# Patient Record
Sex: Female | Born: 2000 | Race: White | Hispanic: No | Marital: Single
Health system: Southern US, Community
[De-identification: ages and names within clinical notes are randomized; demographics above are authoritative.]

---
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
## 2021-02-26 ENCOUNTER — Other Ambulatory Visit (HOSPITAL_COMMUNITY): Payer: Self-pay

## 2021-02-26 MED ORDER — NORETHINDRONE ACET-ETHINYL EST 1-20 MG-MCG PO TABS
ORAL_TABLET | ORAL | 0 refills | Status: AC
  Filled 2021-02-26: qty 21, 21d supply, fill #0

## 2021-02-28 ENCOUNTER — Other Ambulatory Visit (HOSPITAL_COMMUNITY): Payer: Self-pay

## 2021-03-04 ENCOUNTER — Other Ambulatory Visit (HOSPITAL_COMMUNITY): Payer: Self-pay

## 2021-03-04 DIAGNOSIS — N926 Irregular menstruation, unspecified: Secondary | ICD-10-CM | POA: Diagnosis not present

## 2021-03-04 MED ORDER — NORETHINDRONE ACET-ETHINYL EST 1-20 MG-MCG PO TABS
1.0000 | ORAL_TABLET | Freq: Every day | ORAL | 3 refills | Status: AC
  Filled 2021-04-01: qty 84, 84d supply, fill #0

## 2021-04-01 ENCOUNTER — Other Ambulatory Visit (HOSPITAL_COMMUNITY): Payer: Self-pay

## 2021-08-23 DIAGNOSIS — H5213 Myopia, bilateral: Secondary | ICD-10-CM | POA: Diagnosis not present

## 2021-12-23 DIAGNOSIS — F418 Other specified anxiety disorders: Secondary | ICD-10-CM | POA: Diagnosis not present

## 2021-12-23 DIAGNOSIS — N912 Amenorrhea, unspecified: Secondary | ICD-10-CM | POA: Diagnosis not present

## 2022-01-20 ENCOUNTER — Other Ambulatory Visit (HOSPITAL_COMMUNITY): Payer: Self-pay

## 2022-01-20 DIAGNOSIS — F4323 Adjustment disorder with mixed anxiety and depressed mood: Secondary | ICD-10-CM | POA: Diagnosis not present

## 2022-01-21 ENCOUNTER — Other Ambulatory Visit (HOSPITAL_COMMUNITY): Payer: Self-pay

## 2022-01-21 ENCOUNTER — Other Ambulatory Visit: Payer: Self-pay

## 2022-01-21 MED ORDER — ESCITALOPRAM OXALATE 10 MG PO TABS
ORAL_TABLET | ORAL | 0 refills | Status: AC
  Filled 2022-01-21: qty 30, 30d supply, fill #0

## 2022-01-22 ENCOUNTER — Other Ambulatory Visit (HOSPITAL_COMMUNITY): Payer: Self-pay

## 2022-01-24 ENCOUNTER — Other Ambulatory Visit (HOSPITAL_COMMUNITY): Payer: Self-pay

## 2022-02-03 DIAGNOSIS — N911 Secondary amenorrhea: Secondary | ICD-10-CM | POA: Diagnosis not present

## 2022-02-03 DIAGNOSIS — Z3202 Encounter for pregnancy test, result negative: Secondary | ICD-10-CM | POA: Diagnosis not present

## 2022-02-04 DIAGNOSIS — F4323 Adjustment disorder with mixed anxiety and depressed mood: Secondary | ICD-10-CM | POA: Diagnosis not present

## 2022-02-07 ENCOUNTER — Other Ambulatory Visit (HOSPITAL_COMMUNITY): Payer: Self-pay

## 2022-02-07 DIAGNOSIS — F418 Other specified anxiety disorders: Secondary | ICD-10-CM | POA: Diagnosis not present

## 2022-02-07 DIAGNOSIS — Z6841 Body Mass Index (BMI) 40.0 and over, adult: Secondary | ICD-10-CM | POA: Diagnosis not present

## 2022-02-07 MED ORDER — PROPRANOLOL HCL 20 MG PO TABS
ORAL_TABLET | ORAL | 0 refills | Status: DC
  Filled 2022-02-07: qty 90, 30d supply, fill #0

## 2022-02-18 DIAGNOSIS — F4323 Adjustment disorder with mixed anxiety and depressed mood: Secondary | ICD-10-CM | POA: Diagnosis not present

## 2022-02-18 DIAGNOSIS — N911 Secondary amenorrhea: Secondary | ICD-10-CM | POA: Diagnosis not present

## 2022-02-18 DIAGNOSIS — Z01419 Encounter for gynecological examination (general) (routine) without abnormal findings: Secondary | ICD-10-CM | POA: Diagnosis not present

## 2022-03-04 DIAGNOSIS — F4323 Adjustment disorder with mixed anxiety and depressed mood: Secondary | ICD-10-CM | POA: Diagnosis not present

## 2022-03-05 ENCOUNTER — Other Ambulatory Visit (HOSPITAL_COMMUNITY): Payer: Self-pay

## 2022-03-06 ENCOUNTER — Other Ambulatory Visit (HOSPITAL_COMMUNITY): Payer: Self-pay

## 2022-03-07 ENCOUNTER — Other Ambulatory Visit (HOSPITAL_COMMUNITY): Payer: Self-pay

## 2022-03-07 MED ORDER — PROPRANOLOL HCL ER 80 MG PO CP24
ORAL_CAPSULE | ORAL | 1 refills | Status: DC
  Filled 2022-03-07: qty 30, 30d supply, fill #0

## 2022-03-07 MED ORDER — FLUOXETINE HCL 20 MG PO CAPS
ORAL_CAPSULE | ORAL | 1 refills | Status: AC
  Filled 2022-03-07: qty 30, 30d supply, fill #0

## 2022-03-10 ENCOUNTER — Other Ambulatory Visit (HOSPITAL_COMMUNITY): Payer: Self-pay

## 2022-03-13 ENCOUNTER — Other Ambulatory Visit (HOSPITAL_COMMUNITY): Payer: Self-pay

## 2022-03-13 DIAGNOSIS — Z6841 Body Mass Index (BMI) 40.0 and over, adult: Secondary | ICD-10-CM | POA: Diagnosis not present

## 2022-03-13 DIAGNOSIS — F418 Other specified anxiety disorders: Secondary | ICD-10-CM | POA: Diagnosis not present

## 2022-03-13 MED ORDER — PROPRANOLOL HCL 20 MG PO TABS
ORAL_TABLET | ORAL | 1 refills | Status: AC
  Filled 2022-03-13: qty 90, 30d supply, fill #0

## 2022-03-13 MED ORDER — FLUVOXAMINE MALEATE 50 MG PO TABS
ORAL_TABLET | ORAL | 0 refills | Status: DC
  Filled 2022-04-04: qty 30, 30d supply, fill #0

## 2022-03-13 MED ORDER — PROPRANOLOL HCL ER 80 MG PO CP24
ORAL_CAPSULE | ORAL | 3 refills | Status: AC
  Filled 2022-03-13 – 2022-04-04 (×2): qty 90, 90d supply, fill #0
  Filled 2022-07-08: qty 90, 90d supply, fill #1
  Filled 2022-10-07: qty 90, 90d supply, fill #2

## 2022-03-18 DIAGNOSIS — F4323 Adjustment disorder with mixed anxiety and depressed mood: Secondary | ICD-10-CM | POA: Diagnosis not present

## 2022-03-25 DIAGNOSIS — N911 Secondary amenorrhea: Secondary | ICD-10-CM | POA: Diagnosis not present

## 2022-03-25 DIAGNOSIS — Z309 Encounter for contraceptive management, unspecified: Secondary | ICD-10-CM | POA: Diagnosis not present

## 2022-03-25 DIAGNOSIS — L680 Hirsutism: Secondary | ICD-10-CM | POA: Diagnosis not present

## 2022-03-25 DIAGNOSIS — E282 Polycystic ovarian syndrome: Secondary | ICD-10-CM | POA: Diagnosis not present

## 2022-03-26 ENCOUNTER — Other Ambulatory Visit (HOSPITAL_COMMUNITY): Payer: Self-pay

## 2022-03-26 MED ORDER — LO LOESTRIN FE 1 MG-10 MCG / 10 MCG PO TABS
ORAL_TABLET | ORAL | 0 refills | Status: DC
  Filled 2022-03-26: qty 84, 84d supply, fill #0

## 2022-03-27 ENCOUNTER — Other Ambulatory Visit (HOSPITAL_COMMUNITY): Payer: Self-pay

## 2022-03-27 MED ORDER — NORETHIN ACE-ETH ESTRAD-FE 1-20 MG-MCG PO TABS
ORAL_TABLET | ORAL | 0 refills | Status: AC
  Filled 2022-03-27: qty 84, 84d supply, fill #0

## 2022-03-28 ENCOUNTER — Other Ambulatory Visit (HOSPITAL_COMMUNITY): Payer: Self-pay

## 2022-04-01 DIAGNOSIS — F4323 Adjustment disorder with mixed anxiety and depressed mood: Secondary | ICD-10-CM | POA: Diagnosis not present

## 2022-04-02 DIAGNOSIS — Z0000 Encounter for general adult medical examination without abnormal findings: Secondary | ICD-10-CM | POA: Diagnosis not present

## 2022-04-04 ENCOUNTER — Other Ambulatory Visit (HOSPITAL_COMMUNITY): Payer: Self-pay

## 2022-04-07 ENCOUNTER — Other Ambulatory Visit (HOSPITAL_COMMUNITY): Payer: Self-pay

## 2022-04-08 ENCOUNTER — Other Ambulatory Visit (HOSPITAL_COMMUNITY): Payer: Self-pay

## 2022-04-23 DIAGNOSIS — F4323 Adjustment disorder with mixed anxiety and depressed mood: Secondary | ICD-10-CM | POA: Diagnosis not present

## 2022-04-25 ENCOUNTER — Other Ambulatory Visit (HOSPITAL_COMMUNITY): Payer: Self-pay

## 2022-04-25 DIAGNOSIS — E282 Polycystic ovarian syndrome: Secondary | ICD-10-CM | POA: Diagnosis not present

## 2022-04-25 DIAGNOSIS — Z6841 Body Mass Index (BMI) 40.0 and over, adult: Secondary | ICD-10-CM | POA: Diagnosis not present

## 2022-04-25 DIAGNOSIS — F418 Other specified anxiety disorders: Secondary | ICD-10-CM | POA: Diagnosis not present

## 2022-04-25 DIAGNOSIS — Z0001 Encounter for general adult medical examination with abnormal findings: Secondary | ICD-10-CM | POA: Diagnosis not present

## 2022-04-25 MED ORDER — PROPRANOLOL HCL 20 MG PO TABS
20.0000 mg | ORAL_TABLET | Freq: Three times a day (TID) | ORAL | 1 refills | Status: AC | PRN
  Filled 2022-04-25: qty 90, 30d supply, fill #0

## 2022-04-25 MED ORDER — FLUVOXAMINE MALEATE 50 MG PO TABS
50.0000 mg | ORAL_TABLET | Freq: Every day | ORAL | 0 refills | Status: AC
  Filled 2022-04-25: qty 90, 90d supply, fill #0

## 2022-04-25 MED ORDER — PROPRANOLOL HCL ER 80 MG PO CP24
80.0000 mg | ORAL_CAPSULE | Freq: Every day | ORAL | 1 refills | Status: DC
  Filled 2022-04-25 – 2023-01-07 (×2): qty 90, 90d supply, fill #0
  Filled 2023-04-10: qty 90, 90d supply, fill #1

## 2022-04-29 DIAGNOSIS — E282 Polycystic ovarian syndrome: Secondary | ICD-10-CM | POA: Diagnosis not present

## 2022-05-01 ENCOUNTER — Other Ambulatory Visit (HOSPITAL_COMMUNITY): Payer: Self-pay

## 2022-05-08 DIAGNOSIS — F4323 Adjustment disorder with mixed anxiety and depressed mood: Secondary | ICD-10-CM | POA: Diagnosis not present

## 2022-05-22 DIAGNOSIS — F4323 Adjustment disorder with mixed anxiety and depressed mood: Secondary | ICD-10-CM | POA: Diagnosis not present

## 2022-06-02 ENCOUNTER — Other Ambulatory Visit (HOSPITAL_COMMUNITY): Payer: Self-pay

## 2022-06-02 MED ORDER — ESCITALOPRAM OXALATE 10 MG PO TABS
ORAL_TABLET | ORAL | 1 refills | Status: DC
  Filled 2022-06-02: qty 30, 30d supply, fill #0
  Filled 2022-07-08: qty 30, 30d supply, fill #1

## 2022-06-05 DIAGNOSIS — F4323 Adjustment disorder with mixed anxiety and depressed mood: Secondary | ICD-10-CM | POA: Diagnosis not present

## 2022-06-16 ENCOUNTER — Other Ambulatory Visit (HOSPITAL_COMMUNITY): Payer: Self-pay

## 2022-06-16 DIAGNOSIS — N926 Irregular menstruation, unspecified: Secondary | ICD-10-CM | POA: Diagnosis not present

## 2022-06-16 DIAGNOSIS — E282 Polycystic ovarian syndrome: Secondary | ICD-10-CM | POA: Diagnosis not present

## 2022-06-16 MED ORDER — SLYND 4 MG PO TABS
ORAL_TABLET | ORAL | 3 refills | Status: AC
  Filled 2022-06-16: qty 84, 84d supply, fill #0
  Filled 2022-10-07: qty 84, 84d supply, fill #1

## 2022-06-19 DIAGNOSIS — F4323 Adjustment disorder with mixed anxiety and depressed mood: Secondary | ICD-10-CM | POA: Diagnosis not present

## 2022-07-03 DIAGNOSIS — F4323 Adjustment disorder with mixed anxiety and depressed mood: Secondary | ICD-10-CM | POA: Diagnosis not present

## 2022-07-08 ENCOUNTER — Other Ambulatory Visit (HOSPITAL_COMMUNITY): Payer: Self-pay

## 2022-07-17 DIAGNOSIS — F4323 Adjustment disorder with mixed anxiety and depressed mood: Secondary | ICD-10-CM | POA: Diagnosis not present

## 2022-08-07 ENCOUNTER — Other Ambulatory Visit (HOSPITAL_COMMUNITY): Payer: Self-pay

## 2022-08-12 ENCOUNTER — Other Ambulatory Visit (HOSPITAL_COMMUNITY): Payer: Self-pay

## 2022-08-13 ENCOUNTER — Other Ambulatory Visit (HOSPITAL_COMMUNITY): Payer: Self-pay

## 2022-08-13 DIAGNOSIS — F4323 Adjustment disorder with mixed anxiety and depressed mood: Secondary | ICD-10-CM | POA: Diagnosis not present

## 2022-08-13 MED ORDER — ESCITALOPRAM OXALATE 10 MG PO TABS
10.0000 mg | ORAL_TABLET | Freq: Every day | ORAL | 1 refills | Status: AC
  Filled 2022-08-13: qty 90, 90d supply, fill #0
  Filled 2022-11-19: qty 90, 90d supply, fill #1

## 2022-08-14 ENCOUNTER — Other Ambulatory Visit (HOSPITAL_COMMUNITY): Payer: Self-pay

## 2022-08-14 MED ORDER — ESCITALOPRAM OXALATE 10 MG PO TABS
10.0000 mg | ORAL_TABLET | Freq: Every day | ORAL | 1 refills | Status: AC
  Filled 2022-08-14 – 2023-02-24 (×2): qty 90, 90d supply, fill #0
  Filled 2023-05-21: qty 90, 90d supply, fill #1

## 2022-09-02 DIAGNOSIS — F4323 Adjustment disorder with mixed anxiety and depressed mood: Secondary | ICD-10-CM | POA: Diagnosis not present

## 2022-09-25 DIAGNOSIS — F4323 Adjustment disorder with mixed anxiety and depressed mood: Secondary | ICD-10-CM | POA: Diagnosis not present

## 2022-10-07 ENCOUNTER — Other Ambulatory Visit (HOSPITAL_COMMUNITY): Payer: Self-pay

## 2022-10-08 ENCOUNTER — Other Ambulatory Visit (HOSPITAL_COMMUNITY): Payer: Self-pay

## 2022-10-22 DIAGNOSIS — F4323 Adjustment disorder with mixed anxiety and depressed mood: Secondary | ICD-10-CM | POA: Diagnosis not present

## 2022-11-04 ENCOUNTER — Other Ambulatory Visit (HOSPITAL_COMMUNITY): Payer: Self-pay

## 2022-11-04 MED ORDER — PENICILLIN V POTASSIUM 500 MG PO TABS
500.0000 mg | ORAL_TABLET | Freq: Four times a day (QID) | ORAL | 1 refills | Status: AC
  Filled 2022-11-04 – 2022-11-05 (×2): qty 28, 7d supply, fill #0

## 2022-11-04 MED ORDER — HYDROCODONE-ACETAMINOPHEN 5-325 MG PO TABS
1.0000 | ORAL_TABLET | Freq: Four times a day (QID) | ORAL | 0 refills | Status: AC | PRN
  Filled 2022-11-04 – 2022-11-05 (×2): qty 10, 3d supply, fill #0

## 2022-11-04 MED ORDER — METHYLPREDNISOLONE 4 MG PO TBPK
ORAL_TABLET | ORAL | 0 refills | Status: AC
  Filled 2022-11-04 – 2022-11-05 (×2): qty 21, 6d supply, fill #0

## 2022-11-04 MED ORDER — DIAZEPAM 10 MG PO TABS
ORAL_TABLET | ORAL | 0 refills | Status: AC
  Filled 2022-11-04 – 2022-11-05 (×2): qty 1, 1d supply, fill #0

## 2022-11-05 ENCOUNTER — Other Ambulatory Visit: Payer: Self-pay

## 2022-11-05 ENCOUNTER — Other Ambulatory Visit (HOSPITAL_COMMUNITY): Payer: Self-pay

## 2022-11-19 ENCOUNTER — Other Ambulatory Visit (HOSPITAL_COMMUNITY): Payer: Self-pay

## 2022-11-20 DIAGNOSIS — F4323 Adjustment disorder with mixed anxiety and depressed mood: Secondary | ICD-10-CM | POA: Diagnosis not present

## 2022-12-15 DIAGNOSIS — F4323 Adjustment disorder with mixed anxiety and depressed mood: Secondary | ICD-10-CM | POA: Diagnosis not present

## 2022-12-17 DIAGNOSIS — H6092 Unspecified otitis externa, left ear: Secondary | ICD-10-CM | POA: Diagnosis not present

## 2022-12-17 DIAGNOSIS — N3000 Acute cystitis without hematuria: Secondary | ICD-10-CM | POA: Diagnosis not present

## 2023-01-06 DIAGNOSIS — F4323 Adjustment disorder with mixed anxiety and depressed mood: Secondary | ICD-10-CM | POA: Diagnosis not present

## 2023-01-07 ENCOUNTER — Other Ambulatory Visit (HOSPITAL_COMMUNITY): Payer: Self-pay

## 2023-01-08 ENCOUNTER — Other Ambulatory Visit: Payer: Self-pay

## 2023-01-26 DIAGNOSIS — B974 Respiratory syncytial virus as the cause of diseases classified elsewhere: Secondary | ICD-10-CM | POA: Diagnosis not present

## 2023-01-26 DIAGNOSIS — J029 Acute pharyngitis, unspecified: Secondary | ICD-10-CM | POA: Diagnosis not present

## 2023-01-26 DIAGNOSIS — Z20828 Contact with and (suspected) exposure to other viral communicable diseases: Secondary | ICD-10-CM | POA: Diagnosis not present

## 2023-01-26 DIAGNOSIS — Z1339 Encounter for screening examination for other mental health and behavioral disorders: Secondary | ICD-10-CM | POA: Diagnosis not present

## 2023-01-28 ENCOUNTER — Telehealth: Payer: Commercial Managed Care - PPO | Admitting: Physician Assistant

## 2023-01-28 DIAGNOSIS — J210 Acute bronchiolitis due to respiratory syncytial virus: Secondary | ICD-10-CM

## 2023-01-28 MED ORDER — PROMETHAZINE-DM 6.25-15 MG/5ML PO SYRP: 5.0000 mL | ORAL_SOLUTION | Freq: Four times a day (QID) | ORAL | 0 refills | Status: AC | PRN

## 2023-01-28 MED ORDER — BENZONATATE 100 MG PO CAPS: 100.0000 mg | ORAL_CAPSULE | Freq: Three times a day (TID) | ORAL | 0 refills | Status: AC | PRN

## 2023-01-28 MED ORDER — FLUTICASONE PROPIONATE HFA 110 MCG/ACT IN AERO: 1.0000 | INHALATION_SPRAY | Freq: Two times a day (BID) | RESPIRATORY_TRACT | 0 refills | Status: AC

## 2023-01-28 NOTE — Patient Instructions (Signed)
Joanna Pugh, thank you for joining Mar Daring, PA-C for today's virtual visit.  While this provider is not your primary care provider (PCP), if your PCP is located in our provider database this encounter information will be shared with them immediately following your visit.   [facility] account gives you access to today's visit and all your visits, tests, and labs performed at [facility] " click here if you don't have a [facility] account or go to [email or web address]  Consent: (Patient) Joanna Pugh provided verbal consent for this virtual visit at the beginning of the encounter.  Current Medications:  Current Outpatient Medications:    benzonatate (TESSALON) 100 MG capsule, Take 1 capsule (100 mg total) by mouth 3 (three) times daily as needed., Disp: 30 capsule, Rfl: 0   fluticasone (FLOVENT HFA) 110 MCG/ACT inhaler, Inhale 1 puff into the lungs in the morning and at bedtime., Disp: 1 each, Rfl: 0   promethazine-dextromethorphan (PROMETHAZINE-DM) 6.25-15 MG/5ML syrup, Take 5 mLs by mouth 4 (four) times daily as needed., Disp: 118 mL, Rfl: 0   diazepam (VALIUM) 10 MG tablet, Take 1 tablet 1 hour prior to dental surgery with small sips of water only, Disp: 1 tablet, Rfl: 0   Drospirenone (SLYND) 4 MG TABS, Take 1 tablet by mouth every day for 28 days., Disp: 84 tablet, Rfl: 3   escitalopram (LEXAPRO) 10 MG tablet, Take 1 tablet by mouth once a day., Disp: 30 tablet, Rfl: 0   escitalopram (LEXAPRO) 10 MG tablet, Take 1 tablet (10 mg total) by mouth daily., Disp: 90 tablet, Rfl: 1   escitalopram (LEXAPRO) 10 MG tablet, Take 1 tablet (10 mg total) by mouth daily., Disp: 90 tablet, Rfl: 1   FLUoxetine (PROZAC) 20 MG capsule, Take 1 capsule by mouth daily, Disp: 30 capsule, Rfl: 1   fluvoxaMINE (LUVOX) 50 MG tablet, Take 1 tablet (50 mg total) by mouth daily., Disp: 90 tablet, Rfl: 0   HYDROcodone-acetaminophen (NORCO/VICODIN) 5-325 MG tablet,  Take 1 tablet by mouth every 6 (six) hours as needed for pain after dental surgery., Disp: 10 tablet, Rfl: 0   methylPREDNISolone (MEDROL DOSEPAK) 4 MG TBPK tablet, Take as dircted per package instructions, Disp: 21 tablet, Rfl: 0   norethindrone-ethinyl estradiol (LOESTRIN) 1-20 MG-MCG tablet, TAKE 1 TABLET BY MOUTH ONCE A DAY * NEEDS APPT FOR REFILLS., Disp: 21 tablet, Rfl: 0   norethindrone-ethinyl estradiol (LOESTRIN) 1-20 MG-MCG tablet, TAKE 1 TABLET BY MOUTH ONCE DAILY FOR 21 DAYS., Disp: 21 tablet, Rfl: 0   norethindrone-ethinyl estradiol (LOESTRIN) 1-20 MG-MCG tablet, Take 1 tablet by mouth once daily, Disp: 84 tablet, Rfl: 3   norethindrone-ethinyl estradiol-FE (LOESTRIN FE 1/20) 1-20 MG-MCG tablet, Take 1 tablet by mouth every day, Disp: 84 tablet, Rfl: 0   propranolol (INDERAL) 20 MG tablet, Take 1 tablet by mouth three times a day as needed., Disp: 90 tablet, Rfl: 1   propranolol (INDERAL) 20 MG tablet, Take 1 tablet (20 mg total) by mouth 3 (three) times daily as needed., Disp: 90 tablet, Rfl: 1   propranolol ER (INDERAL LA) 80 MG 24 hr capsule, Take 1 capsule by mouth every day., Disp: 90 capsule, Rfl: 3   propranolol ER (INDERAL LA) 80 MG 24 hr capsule, Take 1 capsule (80 mg total) by mouth daily., Disp: 90 capsule, Rfl: 1   Medications ordered in this encounter:  Meds ordered this encounter  Medications   fluticasone (FLOVENT HFA) 110 MCG/ACT inhaler  Sig: Inhale 1 puff into the lungs in the morning and at bedtime.    Dispense:  1 each    Refill:  0    Order Specific Question:   Supervising Provider    Answer:   Chase Picket [id number]   promethazine-dextromethorphan (PROMETHAZINE-DM) 6.25-15 MG/5ML syrup    Sig: Take 5 mLs by mouth 4 (four) times daily as needed.    Dispense:  118 mL    Refill:  0    Order Specific Question:   Supervising Provider    Answer:   Chase Picket [id number]   benzonatate (TESSALON) 100 MG capsule    Sig: Take 1 capsule (100 mg total) by  mouth 3 (three) times daily as needed.    Dispense:  30 capsule    Refill:  0    Order Specific Question:   Supervising Provider    Answer:   Chase Picket [id number]     *If you need refills on other medications prior to your next appointment, please contact your pharmacy*  Follow-Up: Call back or seek an in-person evaluation if the symptoms worsen or if the condition fails to improve as anticipated.  [place] [phone]  Other Instructions  Respiratory Syncytial Virus Infection, Adult Respiratory syncytial virus (RSV) infection is an infection caused by RSV, a common virus. This virus is similar to viruses that cause the common cold and the flu. RSV infection can affect the nose, throat, windpipe, and lungs (respiratory system). When the infection is severe, it can cause: Bronchiolitis. This condition causes inflammation of the air passages in the lungs (bronchioles). Pneumonia. This condition causes inflammation of the air sacs in the lungs. RSV infection spreads from person to person (is contagious) through droplets from coughs and sneezes (respiratory secretions). This condition is rarely serious when it occurs in adults. What are the causes? This condition is caused by contact with RSV. This can happen by: Breathing respiratory secretions from someone who has the infection. Touching something that has been exposed to the virus (is contaminated) and then touching your mouth, nose, or eyes. Coming in close contact with someone who has this infection. This may happen if you: Hug or kiss. Shake or hold hands. Eat or drink using the same dishes or utensils. What increases the risk? The following factors may make you more likely to develop this condition: Being 45 years of age or older. Having certain health conditions, including: A long-term (chronic) lung condition, such as chronic obstructive pulmonary disease (COPD). An immune system that is weak. This is  your body's defense system. Down syndrome. Heart disease. Working in a hospital or other health care facility. Living in a long-term health care facility. RSV infections are most common from the months of November to April, but they can happen any time of year. What are the signs or symptoms? Symptoms of this condition include: Having a runny nose. Coughing. You may have a cough that brings up mucus (productive cough). Sneezing. Having a fever. Wanting to eat less than usual. Breathing loudly (wheezing). Having shortness of breath. Having fluid build up in the lungs (respiratory distress). How is this diagnosed? This condition may be diagnosed based on: Your symptoms. Your medical history. A physical exam. A chest X-ray to rule out pneumonia. Blood tests or tests of mucus from your lungs (sputum). These tests may be done for older adults. A test of a sample of your respiratory secretions. How is this treated? In most cases, the  RSV infection will go away after 1-2 weeks of caring for yourself at home.  Sometimes, RSV infection is severe and can cause bronchiolitis or pneumonia. If you develop one or both of these conditions, you may need to be treated in the hospital. You may be given: Oxygen therapy. Antiviral medicine. Medicines to open your bronchioles (bronchodilators). Follow these instructions at home: Medicines Take over-the-counter and prescription medicines only as told by your health care provider. If you were prescribed an antiviral medicine, take it as told by your health care provider. Do not stop using the antiviral even if you start to feel better. Lifestyle  Eat a healthy diet. Do not drink alcohol. Do not use any products that contain nicotine or tobacco, such as cigarettes, e-cigarettes, and chewing tobacco. If you need help quitting, ask your health care provider. Rest at home until your symptoms go away. Return to your normal activities as told by your  health care provider. Ask your health care provider what activities are safe for you. General instructions  Drink enough fluid to keep your urine pale yellow. Gargle with a salt-water mixture 3-4 times a day or as needed. To make a salt-water mixture, completely dissolve -1 tsp (3-6 g) of salt in 1 cup (237 mL) of warm water. Keep all follow-up visits as told by your health care provider. This is important. How is this prevented? To prevent catching and spreading RSV: Wash your hands often with soap and water for at least 20 seconds. If soap and water are not available, use hand sanitizer. Do not touch your face without first cleaning your hands. Stay home if you have symptoms of the common cold or the flu. Cover your nose and mouth when you cough or sneeze. Avoid large groups of people. Keep a safe distance of about 6 feet (1.8 m) from people who are coughing or sneezing. Where to find more information Centers for Disease Control and Prevention: http://www.wolf.info/ Contact a health care provider if: Your symptoms get worse or have not changed after 2 weeks. You have: A fever. Hot flashes, sweating, or chills that keep happening. A cough that brings up much more mucus than usual. A cough that brings up blood. You feel: Very tired (lethargic). Confused. Get help right away if: You have increased or severe trouble breathing. You lose consciousness. These symptoms may represent a serious problem that is an emergency. Do not wait to see if the symptoms will go away. Get medical help right away. Call your local emergency services (911 in the [place] Do not drive yourself to the hospital. Summary Respiratory syncytial virus (RSV) infection is an infection caused by RSV, a common virus. RSV infection can affect the nose, throat, windpipe, and lungs (respiratory system). When the infection is severe, it can cause bronchiolitis or pneumonia. Take over-the-counter and prescription medicines only as  told by your health care provider. Contact a health care provider if your symptoms get worse or have not changed after 2 weeks. This information is not intended to replace advice given to you by your health care provider. Make sure you discuss any questions you have with your health care provider. Document Revised: 08/24/2019 Document Reviewed: 08/31/2019 Elsevier Patient Education  2022 [employer].    If you have been instructed to have an in-person evaluation today at a local Urgent Care facility, please use the link below. It will take you to a list of all of our available [facility], including address, phone number and  hours of operation. Please do not delay care.  [facility]  If you or a family member do not have a primary care provider, use the link below to schedule a visit and establish care. When you choose a [facility] primary care physician or advanced practice provider, you gain a long-term partner in health. Find a Primary Care Provider  Learn more about [facility]'s in-office and virtual care options: [facility]

## 2023-01-28 NOTE — Progress Notes (Signed)
Virtual Visit Consent   Joanna Pugh, you are scheduled for a virtual visit with a [facility] provider today. Just as with appointments in the office, your consent must be obtained to participate. Your consent will be active for this visit and any virtual visit you may have with one of our providers in the next 365 days. If you have a MyChart account, a copy of this consent can be sent to you electronically.  As this is a virtual visit, video technology does not allow for your provider to perform a traditional examination. This may limit your provider's ability to fully assess your condition. If your provider identifies any concerns that need to be evaluated in person or the need to arrange testing (such as labs, EKG, etc.), we will make arrangements to do so. Although advances in technology are sophisticated, we cannot ensure that it will always work on either your end or our end. If the connection with a video visit is poor, the visit may have to be switched to a telephone visit. With either a video or telephone visit, we are not always able to ensure that we have a secure connection.  By engaging in this virtual visit, you consent to the provision of healthcare and authorize for your insurance to be billed (if applicable) for the services provided during this visit. Depending on your insurance coverage, you may receive a charge related to this service.  I need to obtain your verbal consent now. Are you willing to proceed with your visit today? Joanna Pugh has provided verbal consent on 01/28/2023 for a virtual visit (video or telephone). Mar Daring, PA-C  Date: 01/28/2023 11:36 AM  Virtual Visit via Video Note   I, Mar Daring, connected with  Joanna Pugh  ([id number], [birth year]) on 01/28/23 at 11:30 AM EST by a video-enabled telemedicine application and verified that I am speaking with the correct person using two identifiers.  Location: Patient: Virtual Visit  Location Patient: Home Provider: Virtual Visit Location Provider: Home Office   I discussed the limitations of evaluation and management by telemedicine and the availability of in person appointments. The patient expressed understanding and agreed to proceed.    History of Present Illness: Joanna Pugh is a 22 y.o. who identifies as a female who was assigned female at birth, and is being seen today for RSV. Diagnosed in person UC on Monday 01/26/23. Was given an Albuterol inhaler. Still having increased cough, shortness of breath, wheezing. Cough is affecting her sleep. Symptoms started on Thursday last week.    Problems: There are no problems to display for this patient.   Allergies: Not on File Medications:  Current Outpatient Medications:    benzonatate (TESSALON) 100 MG capsule, Take 1 capsule (100 mg total) by mouth 3 (three) times daily as needed., Disp: 30 capsule, Rfl: 0   fluticasone (FLOVENT HFA) 110 MCG/ACT inhaler, Inhale 1 puff into the lungs in the morning and at bedtime., Disp: 1 each, Rfl: 0   promethazine-dextromethorphan (PROMETHAZINE-DM) 6.25-15 MG/5ML syrup, Take 5 mLs by mouth 4 (four) times daily as needed., Disp: 118 mL, Rfl: 0   diazepam (VALIUM) 10 MG tablet, Take 1 tablet 1 hour prior to dental surgery with small sips of water only, Disp: 1 tablet, Rfl: 0   Drospirenone (SLYND) 4 MG TABS, Take 1 tablet by mouth every day for 28 days., Disp: 84 tablet, Rfl: 3   escitalopram (LEXAPRO) 10 MG tablet, Take 1 tablet by mouth once  a day., Disp: 30 tablet, Rfl: 0   escitalopram (LEXAPRO) 10 MG tablet, Take 1 tablet (10 mg total) by mouth daily., Disp: 90 tablet, Rfl: 1   escitalopram (LEXAPRO) 10 MG tablet, Take 1 tablet (10 mg total) by mouth daily., Disp: 90 tablet, Rfl: 1   FLUoxetine (PROZAC) 20 MG capsule, Take 1 capsule by mouth daily, Disp: 30 capsule, Rfl: 1   fluvoxaMINE (LUVOX) 50 MG tablet, Take 1 tablet (50 mg total) by mouth daily., Disp: 90 tablet, Rfl: 0    HYDROcodone-acetaminophen (NORCO/VICODIN) 5-325 MG tablet, Take 1 tablet by mouth every 6 (six) hours as needed for pain after dental surgery., Disp: 10 tablet, Rfl: 0   methylPREDNISolone (MEDROL DOSEPAK) 4 MG TBPK tablet, Take as dircted per package instructions, Disp: 21 tablet, Rfl: 0   norethindrone-ethinyl estradiol (LOESTRIN) 1-20 MG-MCG tablet, TAKE 1 TABLET BY MOUTH ONCE A DAY * NEEDS APPT FOR REFILLS., Disp: 21 tablet, Rfl: 0   norethindrone-ethinyl estradiol (LOESTRIN) 1-20 MG-MCG tablet, TAKE 1 TABLET BY MOUTH ONCE DAILY FOR 21 DAYS., Disp: 21 tablet, Rfl: 0   norethindrone-ethinyl estradiol (LOESTRIN) 1-20 MG-MCG tablet, Take 1 tablet by mouth once daily, Disp: 84 tablet, Rfl: 3   norethindrone-ethinyl estradiol-FE (LOESTRIN FE 1/20) 1-20 MG-MCG tablet, Take 1 tablet by mouth every day, Disp: 84 tablet, Rfl: 0   propranolol (INDERAL) 20 MG tablet, Take 1 tablet by mouth three times a day as needed., Disp: 90 tablet, Rfl: 1   propranolol (INDERAL) 20 MG tablet, Take 1 tablet (20 mg total) by mouth 3 (three) times daily as needed., Disp: 90 tablet, Rfl: 1   propranolol ER (INDERAL LA) 80 MG 24 hr capsule, Take 1 capsule by mouth every day., Disp: 90 capsule, Rfl: 3   propranolol ER (INDERAL LA) 80 MG 24 hr capsule, Take 1 capsule (80 mg total) by mouth daily., Disp: 90 capsule, Rfl: 1   Observations/Objective: Patient is well-developed, well-nourished in no acute distress.  Resting comfortably at home.  Head is normocephalic, atraumatic.  No labored breathing.  Speech is clear and coherent with logical content.  Patient is alert and oriented at baseline.    Assessment and Plan: 1. RSV (acute bronchiolitis due to respiratory syncytial virus) - fluticasone (FLOVENT HFA) 110 MCG/ACT inhaler; Inhale 1 puff into the lungs in the morning and at bedtime.  Dispense: 1 each; Refill: 0 - promethazine-dextromethorphan (PROMETHAZINE-DM) 6.25-15 MG/5ML syrup; Take 5 mLs by mouth 4 (four) times  daily as needed.  Dispense: 118 mL; Refill: 0 - benzonatate (TESSALON) 100 MG capsule; Take 1 capsule (100 mg total) by mouth 3 (three) times daily as needed.  Dispense: 30 capsule; Refill: 0  - Worsening over a week despite OTC medications - Will treat with Flovent inhaler, Promethazine DM (nighttime) and tessalon perles - Can continue Mucinex (daytime) - Continue previously prescribed Albuterol inhaler - Push fluids.  - Rest.  - Steam and humidifier can help - Seek in person evaluation if worsening or symptoms fail to improve    Follow Up Instructions: I discussed the assessment and treatment plan with the patient. The patient was provided an opportunity to ask questions and all were answered. The patient agreed with the plan and demonstrated an understanding of the instructions.  A copy of instructions were sent to the patient via MyChart unless otherwise noted below.    The patient was advised to call back or seek an in-person evaluation if the symptoms worsen or if the condition fails to improve as anticipated.  Time:  I spent 15 minutes with the patient via telehealth technology discussing the above problems/concerns.    Mar Daring, PA-C

## 2023-02-24 ENCOUNTER — Other Ambulatory Visit: Payer: Self-pay

## 2023-02-24 ENCOUNTER — Other Ambulatory Visit (HOSPITAL_COMMUNITY): Payer: Self-pay

## 2023-04-10 ENCOUNTER — Other Ambulatory Visit (HOSPITAL_COMMUNITY): Payer: Self-pay

## 2023-04-14 ENCOUNTER — Other Ambulatory Visit (HOSPITAL_COMMUNITY): Payer: Self-pay

## 2023-04-15 ENCOUNTER — Other Ambulatory Visit (HOSPITAL_COMMUNITY): Payer: Self-pay

## 2023-05-21 ENCOUNTER — Other Ambulatory Visit (HOSPITAL_COMMUNITY): Payer: Self-pay

## 2023-06-19 ENCOUNTER — Other Ambulatory Visit (HOSPITAL_COMMUNITY): Payer: Self-pay

## 2023-06-19 ENCOUNTER — Other Ambulatory Visit: Payer: Self-pay

## 2023-06-19 MED ORDER — ESCITALOPRAM OXALATE 20 MG PO TABS
20.0000 mg | ORAL_TABLET | Freq: Every day | ORAL | 0 refills | Status: AC
  Filled 2023-06-19: qty 90, 90d supply, fill #0

## 2023-07-13 ENCOUNTER — Other Ambulatory Visit (HOSPITAL_COMMUNITY): Payer: Self-pay

## 2023-07-13 MED ORDER — PROPRANOLOL HCL ER 80 MG PO CP24
80.0000 mg | ORAL_CAPSULE | Freq: Every day | ORAL | 0 refills | Status: DC
  Filled 2023-07-13: qty 30, 30d supply, fill #0

## 2023-07-14 ENCOUNTER — Other Ambulatory Visit (HOSPITAL_COMMUNITY): Payer: Self-pay

## 2023-08-10 ENCOUNTER — Other Ambulatory Visit (HOSPITAL_COMMUNITY): Payer: Self-pay

## 2023-08-11 ENCOUNTER — Other Ambulatory Visit (HOSPITAL_COMMUNITY): Payer: Self-pay

## 2023-08-12 ENCOUNTER — Other Ambulatory Visit (HOSPITAL_COMMUNITY): Payer: Self-pay

## 2023-08-14 ENCOUNTER — Other Ambulatory Visit (HOSPITAL_COMMUNITY): Payer: Self-pay

## 2023-08-14 MED ORDER — PROPRANOLOL HCL ER 80 MG PO CP24
80.0000 mg | ORAL_CAPSULE | Freq: Every day | ORAL | 0 refills | Status: DC
  Filled 2023-08-14: qty 30, 30d supply, fill #0

## 2023-08-27 DIAGNOSIS — E282 Polycystic ovarian syndrome: Secondary | ICD-10-CM | POA: Diagnosis not present

## 2023-08-27 DIAGNOSIS — Z23 Encounter for immunization: Secondary | ICD-10-CM | POA: Diagnosis not present

## 2023-08-27 DIAGNOSIS — Z7689 Persons encountering health services in other specified circumstances: Secondary | ICD-10-CM | POA: Diagnosis not present

## 2023-08-27 DIAGNOSIS — F329 Major depressive disorder, single episode, unspecified: Secondary | ICD-10-CM | POA: Diagnosis not present

## 2023-08-27 DIAGNOSIS — L680 Hirsutism: Secondary | ICD-10-CM | POA: Diagnosis not present

## 2023-08-27 DIAGNOSIS — Z6841 Body Mass Index (BMI) 40.0 and over, adult: Secondary | ICD-10-CM | POA: Diagnosis not present

## 2023-08-27 DIAGNOSIS — F429 Obsessive-compulsive disorder, unspecified: Secondary | ICD-10-CM | POA: Diagnosis not present

## 2023-08-27 DIAGNOSIS — F419 Anxiety disorder, unspecified: Secondary | ICD-10-CM | POA: Diagnosis not present

## 2023-09-10 DIAGNOSIS — Z0000 Encounter for general adult medical examination without abnormal findings: Secondary | ICD-10-CM | POA: Diagnosis not present

## 2023-09-18 ENCOUNTER — Other Ambulatory Visit: Payer: Self-pay

## 2023-09-18 ENCOUNTER — Other Ambulatory Visit (HOSPITAL_COMMUNITY): Payer: Self-pay

## 2023-09-18 DIAGNOSIS — E282 Polycystic ovarian syndrome: Secondary | ICD-10-CM | POA: Diagnosis not present

## 2023-09-18 DIAGNOSIS — F329 Major depressive disorder, single episode, unspecified: Secondary | ICD-10-CM | POA: Diagnosis not present

## 2023-09-18 DIAGNOSIS — Z23 Encounter for immunization: Secondary | ICD-10-CM | POA: Diagnosis not present

## 2023-09-18 DIAGNOSIS — F419 Anxiety disorder, unspecified: Secondary | ICD-10-CM | POA: Diagnosis not present

## 2023-09-18 DIAGNOSIS — Z1331 Encounter for screening for depression: Secondary | ICD-10-CM | POA: Diagnosis not present

## 2023-09-18 DIAGNOSIS — L680 Hirsutism: Secondary | ICD-10-CM | POA: Diagnosis not present

## 2023-09-18 DIAGNOSIS — Z124 Encounter for screening for malignant neoplasm of cervix: Secondary | ICD-10-CM | POA: Diagnosis not present

## 2023-09-18 DIAGNOSIS — E782 Mixed hyperlipidemia: Secondary | ICD-10-CM | POA: Diagnosis not present

## 2023-09-18 DIAGNOSIS — F429 Obsessive-compulsive disorder, unspecified: Secondary | ICD-10-CM | POA: Diagnosis not present

## 2023-09-18 DIAGNOSIS — Z0001 Encounter for general adult medical examination with abnormal findings: Secondary | ICD-10-CM | POA: Diagnosis not present

## 2023-09-18 MED ORDER — PROPRANOLOL HCL 20 MG PO TABS
20.0000 mg | ORAL_TABLET | Freq: Every day | ORAL | 1 refills | Status: AC | PRN
  Filled 2023-09-18: qty 36, 36d supply, fill #0

## 2023-09-18 MED ORDER — PROPRANOLOL HCL 80 MG PO TABS
80.0000 mg | ORAL_TABLET | Freq: Every day | ORAL | 1 refills | Status: AC
  Filled 2023-09-18: qty 90, 90d supply, fill #0

## 2023-09-18 MED ORDER — ESCITALOPRAM OXALATE 20 MG PO TABS
20.0000 mg | ORAL_TABLET | Freq: Every day | ORAL | 1 refills | Status: AC
  Filled 2023-09-18: qty 90, 90d supply, fill #0
  Filled 2024-06-15 – 2024-06-20 (×2): qty 90, 90d supply, fill #1

## 2023-09-22 ENCOUNTER — Other Ambulatory Visit (HOSPITAL_COMMUNITY): Payer: Self-pay

## 2023-09-22 MED ORDER — PROPRANOLOL HCL ER 80 MG PO CP24
80.0000 mg | ORAL_CAPSULE | Freq: Every day | ORAL | 1 refills | Status: AC
  Filled 2023-09-22: qty 90, 90d supply, fill #0
  Filled 2024-03-15: qty 90, 90d supply, fill #1

## 2023-09-24 DIAGNOSIS — Z23 Encounter for immunization: Secondary | ICD-10-CM | POA: Diagnosis not present

## 2023-09-25 DIAGNOSIS — R519 Headache, unspecified: Secondary | ICD-10-CM | POA: Diagnosis not present

## 2023-09-25 DIAGNOSIS — J029 Acute pharyngitis, unspecified: Secondary | ICD-10-CM | POA: Diagnosis not present

## 2023-09-25 DIAGNOSIS — Z20818 Contact with and (suspected) exposure to other bacterial communicable diseases: Secondary | ICD-10-CM | POA: Diagnosis not present

## 2023-12-18 ENCOUNTER — Other Ambulatory Visit (HOSPITAL_COMMUNITY): Payer: Self-pay

## 2023-12-18 DIAGNOSIS — E782 Mixed hyperlipidemia: Secondary | ICD-10-CM | POA: Diagnosis not present

## 2023-12-18 MED ORDER — PROPRANOLOL HCL 20 MG PO TABS
20.0000 mg | ORAL_TABLET | Freq: Every day | ORAL | 1 refills | Status: DC | PRN
  Filled 2023-12-18: qty 36, 36d supply, fill #0
  Filled 2024-12-14: qty 36, 36d supply, fill #1

## 2023-12-18 MED ORDER — PROPRANOLOL HCL ER 80 MG PO CP24
80.0000 mg | ORAL_CAPSULE | Freq: Every day | ORAL | 1 refills | Status: DC
  Filled 2023-12-18: qty 90, 90d supply, fill #0
  Filled 2024-06-15 – 2024-06-20 (×2): qty 90, 90d supply, fill #1

## 2023-12-18 MED ORDER — ESCITALOPRAM OXALATE 20 MG PO TABS
20.0000 mg | ORAL_TABLET | Freq: Every day | ORAL | 1 refills | Status: DC
  Filled 2023-12-18: qty 90, 90d supply, fill #0
  Filled 2024-03-15: qty 90, 90d supply, fill #1

## 2023-12-25 ENCOUNTER — Other Ambulatory Visit: Payer: Self-pay

## 2023-12-25 ENCOUNTER — Other Ambulatory Visit (HOSPITAL_COMMUNITY): Payer: Self-pay

## 2023-12-25 DIAGNOSIS — M542 Cervicalgia: Secondary | ICD-10-CM | POA: Diagnosis not present

## 2023-12-25 DIAGNOSIS — E282 Polycystic ovarian syndrome: Secondary | ICD-10-CM | POA: Diagnosis not present

## 2023-12-25 DIAGNOSIS — F419 Anxiety disorder, unspecified: Secondary | ICD-10-CM | POA: Diagnosis not present

## 2023-12-25 DIAGNOSIS — L304 Erythema intertrigo: Secondary | ICD-10-CM | POA: Diagnosis not present

## 2023-12-25 DIAGNOSIS — L680 Hirsutism: Secondary | ICD-10-CM | POA: Diagnosis not present

## 2023-12-25 DIAGNOSIS — E782 Mixed hyperlipidemia: Secondary | ICD-10-CM | POA: Diagnosis not present

## 2023-12-25 DIAGNOSIS — F429 Obsessive-compulsive disorder, unspecified: Secondary | ICD-10-CM | POA: Diagnosis not present

## 2023-12-25 DIAGNOSIS — N6489 Other specified disorders of breast: Secondary | ICD-10-CM | POA: Diagnosis not present

## 2023-12-25 DIAGNOSIS — F329 Major depressive disorder, single episode, unspecified: Secondary | ICD-10-CM | POA: Diagnosis not present

## 2023-12-25 DIAGNOSIS — M25511 Pain in right shoulder: Secondary | ICD-10-CM | POA: Diagnosis not present

## 2023-12-25 MED ORDER — MICONAZOLE 2 % EX POWD
1.0000 | Freq: Two times a day (BID) | CUTANEOUS | 3 refills | Status: AC
  Filled 2023-12-25: qty 85, 14d supply, fill #0

## 2023-12-25 MED ORDER — MOMETASONE FUROATE 0.1 % EX OINT
1.0000 | TOPICAL_OINTMENT | Freq: Every day | CUTANEOUS | 3 refills | Status: AC
  Filled 2023-12-25: qty 15, 7d supply, fill #0

## 2023-12-28 ENCOUNTER — Other Ambulatory Visit (HOSPITAL_COMMUNITY): Payer: Self-pay

## 2024-01-06 DIAGNOSIS — M25511 Pain in right shoulder: Secondary | ICD-10-CM | POA: Diagnosis not present

## 2024-01-06 DIAGNOSIS — M542 Cervicalgia: Secondary | ICD-10-CM | POA: Diagnosis not present

## 2024-01-06 DIAGNOSIS — M25512 Pain in left shoulder: Secondary | ICD-10-CM | POA: Diagnosis not present

## 2024-01-12 DIAGNOSIS — M542 Cervicalgia: Secondary | ICD-10-CM | POA: Diagnosis not present

## 2024-01-12 DIAGNOSIS — M25512 Pain in left shoulder: Secondary | ICD-10-CM | POA: Diagnosis not present

## 2024-01-12 DIAGNOSIS — M25511 Pain in right shoulder: Secondary | ICD-10-CM | POA: Diagnosis not present

## 2024-01-19 DIAGNOSIS — M542 Cervicalgia: Secondary | ICD-10-CM | POA: Diagnosis not present

## 2024-01-19 DIAGNOSIS — M25511 Pain in right shoulder: Secondary | ICD-10-CM | POA: Diagnosis not present

## 2024-01-19 DIAGNOSIS — M25512 Pain in left shoulder: Secondary | ICD-10-CM | POA: Diagnosis not present

## 2024-01-26 DIAGNOSIS — M25511 Pain in right shoulder: Secondary | ICD-10-CM | POA: Diagnosis not present

## 2024-01-26 DIAGNOSIS — M25512 Pain in left shoulder: Secondary | ICD-10-CM | POA: Diagnosis not present

## 2024-01-26 DIAGNOSIS — M542 Cervicalgia: Secondary | ICD-10-CM | POA: Diagnosis not present

## 2024-02-02 DIAGNOSIS — M25512 Pain in left shoulder: Secondary | ICD-10-CM | POA: Diagnosis not present

## 2024-02-02 DIAGNOSIS — M542 Cervicalgia: Secondary | ICD-10-CM | POA: Diagnosis not present

## 2024-02-02 DIAGNOSIS — M25511 Pain in right shoulder: Secondary | ICD-10-CM | POA: Diagnosis not present

## 2024-02-03 DIAGNOSIS — M25511 Pain in right shoulder: Secondary | ICD-10-CM | POA: Diagnosis not present

## 2024-02-03 DIAGNOSIS — M542 Cervicalgia: Secondary | ICD-10-CM | POA: Diagnosis not present

## 2024-02-03 DIAGNOSIS — M25512 Pain in left shoulder: Secondary | ICD-10-CM | POA: Diagnosis not present

## 2024-02-09 DIAGNOSIS — M25511 Pain in right shoulder: Secondary | ICD-10-CM | POA: Diagnosis not present

## 2024-02-09 DIAGNOSIS — M25512 Pain in left shoulder: Secondary | ICD-10-CM | POA: Diagnosis not present

## 2024-02-09 DIAGNOSIS — M542 Cervicalgia: Secondary | ICD-10-CM | POA: Diagnosis not present

## 2024-02-18 DIAGNOSIS — M542 Cervicalgia: Secondary | ICD-10-CM | POA: Diagnosis not present

## 2024-02-18 DIAGNOSIS — M25511 Pain in right shoulder: Secondary | ICD-10-CM | POA: Diagnosis not present

## 2024-02-18 DIAGNOSIS — M25512 Pain in left shoulder: Secondary | ICD-10-CM | POA: Diagnosis not present

## 2024-02-23 DIAGNOSIS — M25512 Pain in left shoulder: Secondary | ICD-10-CM | POA: Diagnosis not present

## 2024-02-23 DIAGNOSIS — M25511 Pain in right shoulder: Secondary | ICD-10-CM | POA: Diagnosis not present

## 2024-02-23 DIAGNOSIS — M542 Cervicalgia: Secondary | ICD-10-CM | POA: Diagnosis not present

## 2024-03-02 DIAGNOSIS — M542 Cervicalgia: Secondary | ICD-10-CM | POA: Diagnosis not present

## 2024-03-02 DIAGNOSIS — M25511 Pain in right shoulder: Secondary | ICD-10-CM | POA: Diagnosis not present

## 2024-03-02 DIAGNOSIS — M25512 Pain in left shoulder: Secondary | ICD-10-CM | POA: Diagnosis not present

## 2024-03-07 DIAGNOSIS — M25511 Pain in right shoulder: Secondary | ICD-10-CM | POA: Diagnosis not present

## 2024-03-07 DIAGNOSIS — M25512 Pain in left shoulder: Secondary | ICD-10-CM | POA: Diagnosis not present

## 2024-03-07 DIAGNOSIS — M542 Cervicalgia: Secondary | ICD-10-CM | POA: Diagnosis not present

## 2024-03-15 ENCOUNTER — Other Ambulatory Visit (HOSPITAL_COMMUNITY): Payer: Self-pay

## 2024-03-16 DIAGNOSIS — M542 Cervicalgia: Secondary | ICD-10-CM | POA: Diagnosis not present

## 2024-03-16 DIAGNOSIS — M25512 Pain in left shoulder: Secondary | ICD-10-CM | POA: Diagnosis not present

## 2024-03-16 DIAGNOSIS — M25511 Pain in right shoulder: Secondary | ICD-10-CM | POA: Diagnosis not present

## 2024-03-23 DIAGNOSIS — M25512 Pain in left shoulder: Secondary | ICD-10-CM | POA: Diagnosis not present

## 2024-03-23 DIAGNOSIS — M25511 Pain in right shoulder: Secondary | ICD-10-CM | POA: Diagnosis not present

## 2024-03-23 DIAGNOSIS — M542 Cervicalgia: Secondary | ICD-10-CM | POA: Diagnosis not present

## 2024-04-06 DIAGNOSIS — M542 Cervicalgia: Secondary | ICD-10-CM | POA: Diagnosis not present

## 2024-04-06 DIAGNOSIS — M25512 Pain in left shoulder: Secondary | ICD-10-CM | POA: Diagnosis not present

## 2024-04-06 DIAGNOSIS — M25511 Pain in right shoulder: Secondary | ICD-10-CM | POA: Diagnosis not present

## 2024-04-19 DIAGNOSIS — E782 Mixed hyperlipidemia: Secondary | ICD-10-CM | POA: Diagnosis not present

## 2024-04-22 ENCOUNTER — Other Ambulatory Visit (HOSPITAL_COMMUNITY): Payer: Self-pay

## 2024-04-22 DIAGNOSIS — F429 Obsessive-compulsive disorder, unspecified: Secondary | ICD-10-CM | POA: Diagnosis not present

## 2024-04-22 DIAGNOSIS — Z23 Encounter for immunization: Secondary | ICD-10-CM | POA: Diagnosis not present

## 2024-04-22 DIAGNOSIS — E282 Polycystic ovarian syndrome: Secondary | ICD-10-CM | POA: Diagnosis not present

## 2024-04-22 DIAGNOSIS — F329 Major depressive disorder, single episode, unspecified: Secondary | ICD-10-CM | POA: Diagnosis not present

## 2024-04-22 DIAGNOSIS — Z30011 Encounter for initial prescription of contraceptive pills: Secondary | ICD-10-CM | POA: Diagnosis not present

## 2024-04-22 DIAGNOSIS — N912 Amenorrhea, unspecified: Secondary | ICD-10-CM | POA: Diagnosis not present

## 2024-04-22 DIAGNOSIS — F419 Anxiety disorder, unspecified: Secondary | ICD-10-CM | POA: Diagnosis not present

## 2024-04-22 DIAGNOSIS — L304 Erythema intertrigo: Secondary | ICD-10-CM | POA: Diagnosis not present

## 2024-04-22 DIAGNOSIS — E782 Mixed hyperlipidemia: Secondary | ICD-10-CM | POA: Diagnosis not present

## 2024-04-22 MED ORDER — NORGESTIM-ETH ESTRAD TRIPHASIC 0.18/0.215/0.25 MG-35 MCG PO TABS
1.0000 | ORAL_TABLET | Freq: Every day | ORAL | 5 refills | Status: AC
  Filled 2024-04-22: qty 84, 84d supply, fill #0
  Filled 2024-07-22: qty 84, 84d supply, fill #1

## 2024-04-25 ENCOUNTER — Other Ambulatory Visit (HOSPITAL_COMMUNITY): Payer: Self-pay

## 2024-04-25 DIAGNOSIS — M25511 Pain in right shoulder: Secondary | ICD-10-CM | POA: Diagnosis not present

## 2024-04-25 DIAGNOSIS — M542 Cervicalgia: Secondary | ICD-10-CM | POA: Diagnosis not present

## 2024-04-25 DIAGNOSIS — M25512 Pain in left shoulder: Secondary | ICD-10-CM | POA: Diagnosis not present

## 2024-05-02 DIAGNOSIS — M542 Cervicalgia: Secondary | ICD-10-CM | POA: Diagnosis not present

## 2024-05-02 DIAGNOSIS — M25511 Pain in right shoulder: Secondary | ICD-10-CM | POA: Diagnosis not present

## 2024-05-02 DIAGNOSIS — M25512 Pain in left shoulder: Secondary | ICD-10-CM | POA: Diagnosis not present

## 2024-05-09 DIAGNOSIS — M542 Cervicalgia: Secondary | ICD-10-CM | POA: Diagnosis not present

## 2024-05-09 DIAGNOSIS — M25511 Pain in right shoulder: Secondary | ICD-10-CM | POA: Diagnosis not present

## 2024-05-09 DIAGNOSIS — M25512 Pain in left shoulder: Secondary | ICD-10-CM | POA: Diagnosis not present

## 2024-05-16 DIAGNOSIS — M25511 Pain in right shoulder: Secondary | ICD-10-CM | POA: Diagnosis not present

## 2024-05-16 DIAGNOSIS — M25512 Pain in left shoulder: Secondary | ICD-10-CM | POA: Diagnosis not present

## 2024-05-16 DIAGNOSIS — M542 Cervicalgia: Secondary | ICD-10-CM | POA: Diagnosis not present

## 2024-05-23 DIAGNOSIS — M25511 Pain in right shoulder: Secondary | ICD-10-CM | POA: Diagnosis not present

## 2024-05-23 DIAGNOSIS — M25512 Pain in left shoulder: Secondary | ICD-10-CM | POA: Diagnosis not present

## 2024-05-23 DIAGNOSIS — M542 Cervicalgia: Secondary | ICD-10-CM | POA: Diagnosis not present

## 2024-06-01 DIAGNOSIS — M25511 Pain in right shoulder: Secondary | ICD-10-CM | POA: Diagnosis not present

## 2024-06-01 DIAGNOSIS — M25512 Pain in left shoulder: Secondary | ICD-10-CM | POA: Diagnosis not present

## 2024-06-01 DIAGNOSIS — M542 Cervicalgia: Secondary | ICD-10-CM | POA: Diagnosis not present

## 2024-06-10 DIAGNOSIS — M25512 Pain in left shoulder: Secondary | ICD-10-CM | POA: Diagnosis not present

## 2024-06-10 DIAGNOSIS — M542 Cervicalgia: Secondary | ICD-10-CM | POA: Diagnosis not present

## 2024-06-10 DIAGNOSIS — M25511 Pain in right shoulder: Secondary | ICD-10-CM | POA: Diagnosis not present

## 2024-06-15 ENCOUNTER — Other Ambulatory Visit (HOSPITAL_COMMUNITY): Payer: Self-pay

## 2024-06-15 ENCOUNTER — Other Ambulatory Visit: Payer: Self-pay

## 2024-06-20 ENCOUNTER — Other Ambulatory Visit: Payer: Self-pay

## 2024-06-20 ENCOUNTER — Other Ambulatory Visit (HOSPITAL_COMMUNITY): Payer: Self-pay

## 2024-06-21 ENCOUNTER — Other Ambulatory Visit: Payer: Self-pay

## 2024-06-22 DIAGNOSIS — M542 Cervicalgia: Secondary | ICD-10-CM | POA: Diagnosis not present

## 2024-06-22 DIAGNOSIS — M25512 Pain in left shoulder: Secondary | ICD-10-CM | POA: Diagnosis not present

## 2024-06-22 DIAGNOSIS — M25511 Pain in right shoulder: Secondary | ICD-10-CM | POA: Diagnosis not present

## 2024-07-04 DIAGNOSIS — M25511 Pain in right shoulder: Secondary | ICD-10-CM | POA: Diagnosis not present

## 2024-07-04 DIAGNOSIS — M542 Cervicalgia: Secondary | ICD-10-CM | POA: Diagnosis not present

## 2024-07-04 DIAGNOSIS — M25512 Pain in left shoulder: Secondary | ICD-10-CM | POA: Diagnosis not present

## 2024-07-13 DIAGNOSIS — M25512 Pain in left shoulder: Secondary | ICD-10-CM | POA: Diagnosis not present

## 2024-07-13 DIAGNOSIS — M25511 Pain in right shoulder: Secondary | ICD-10-CM | POA: Diagnosis not present

## 2024-07-13 DIAGNOSIS — M542 Cervicalgia: Secondary | ICD-10-CM | POA: Diagnosis not present

## 2024-07-14 ENCOUNTER — Other Ambulatory Visit (HOSPITAL_COMMUNITY): Payer: Self-pay

## 2024-07-14 DIAGNOSIS — N6489 Other specified disorders of breast: Secondary | ICD-10-CM | POA: Diagnosis not present

## 2024-07-14 DIAGNOSIS — M542 Cervicalgia: Secondary | ICD-10-CM | POA: Diagnosis not present

## 2024-07-14 DIAGNOSIS — N62 Hypertrophy of breast: Secondary | ICD-10-CM | POA: Diagnosis not present

## 2024-07-14 DIAGNOSIS — M62838 Other muscle spasm: Secondary | ICD-10-CM | POA: Diagnosis not present

## 2024-07-14 DIAGNOSIS — M25511 Pain in right shoulder: Secondary | ICD-10-CM | POA: Diagnosis not present

## 2024-07-14 MED ORDER — MELOXICAM 7.5 MG PO TABS
7.5000 mg | ORAL_TABLET | Freq: Every day | ORAL | 3 refills | Status: AC | PRN
  Filled 2024-07-14: qty 60, 30d supply, fill #0

## 2024-07-14 MED ORDER — CYCLOBENZAPRINE HCL 5 MG PO TABS
5.0000 mg | ORAL_TABLET | Freq: Three times a day (TID) | ORAL | 3 refills | Status: AC | PRN
  Filled 2024-07-14: qty 90, 30d supply, fill #0

## 2024-07-15 ENCOUNTER — Other Ambulatory Visit (HOSPITAL_COMMUNITY): Payer: Self-pay

## 2024-07-20 DIAGNOSIS — M25512 Pain in left shoulder: Secondary | ICD-10-CM | POA: Diagnosis not present

## 2024-07-20 DIAGNOSIS — M25511 Pain in right shoulder: Secondary | ICD-10-CM | POA: Diagnosis not present

## 2024-07-20 DIAGNOSIS — M542 Cervicalgia: Secondary | ICD-10-CM | POA: Diagnosis not present

## 2024-07-22 ENCOUNTER — Other Ambulatory Visit (HOSPITAL_COMMUNITY): Payer: Self-pay

## 2024-09-12 ENCOUNTER — Other Ambulatory Visit: Payer: Self-pay

## 2024-09-12 ENCOUNTER — Other Ambulatory Visit (HOSPITAL_COMMUNITY): Payer: Self-pay

## 2024-09-12 MED ORDER — PROPRANOLOL HCL ER 80 MG PO CP24
80.0000 mg | ORAL_CAPSULE | Freq: Every day | ORAL | 1 refills | Status: AC
  Filled 2024-09-12: qty 90, 90d supply, fill #0
  Filled 2024-12-19: qty 90, 90d supply, fill #1

## 2024-09-12 MED ORDER — ESCITALOPRAM OXALATE 20 MG PO TABS
20.0000 mg | ORAL_TABLET | Freq: Every day | ORAL | 1 refills | Status: AC
  Filled 2024-09-12: qty 90, 90d supply, fill #0
  Filled 2024-12-14: qty 90, 90d supply, fill #1

## 2024-09-22 ENCOUNTER — Other Ambulatory Visit (HOSPITAL_COMMUNITY): Payer: Self-pay

## 2024-09-22 ENCOUNTER — Other Ambulatory Visit (HOSPITAL_BASED_OUTPATIENT_CLINIC_OR_DEPARTMENT_OTHER): Payer: Self-pay

## 2024-09-22 DIAGNOSIS — Z23 Encounter for immunization: Secondary | ICD-10-CM | POA: Diagnosis not present

## 2024-09-22 DIAGNOSIS — E782 Mixed hyperlipidemia: Secondary | ICD-10-CM | POA: Diagnosis not present

## 2024-09-22 DIAGNOSIS — Z1331 Encounter for screening for depression: Secondary | ICD-10-CM | POA: Diagnosis not present

## 2024-09-22 DIAGNOSIS — Z6841 Body Mass Index (BMI) 40.0 and over, adult: Secondary | ICD-10-CM | POA: Diagnosis not present

## 2024-09-22 DIAGNOSIS — F329 Major depressive disorder, single episode, unspecified: Secondary | ICD-10-CM | POA: Diagnosis not present

## 2024-09-22 DIAGNOSIS — Z0001 Encounter for general adult medical examination with abnormal findings: Secondary | ICD-10-CM | POA: Diagnosis not present

## 2024-09-22 DIAGNOSIS — F419 Anxiety disorder, unspecified: Secondary | ICD-10-CM | POA: Diagnosis not present

## 2024-09-22 DIAGNOSIS — L304 Erythema intertrigo: Secondary | ICD-10-CM | POA: Diagnosis not present

## 2024-09-22 DIAGNOSIS — E282 Polycystic ovarian syndrome: Secondary | ICD-10-CM | POA: Diagnosis not present

## 2024-09-22 MED ORDER — PRAZOSIN HCL 1 MG PO CAPS
1.0000 mg | ORAL_CAPSULE | Freq: Every day | ORAL | 1 refills | Status: AC
  Filled 2024-09-22: qty 90, 90d supply, fill #0

## 2024-09-22 MED ORDER — ZEPBOUND 2.5 MG/0.5ML ~~LOC~~ SOAJ
2.5000 mg | SUBCUTANEOUS | 0 refills | Status: AC
  Filled 2024-09-22: qty 2, 28d supply, fill #0

## 2024-09-26 ENCOUNTER — Other Ambulatory Visit: Payer: Self-pay

## 2024-09-26 ENCOUNTER — Other Ambulatory Visit (HOSPITAL_COMMUNITY): Payer: Self-pay

## 2024-09-28 ENCOUNTER — Other Ambulatory Visit (HOSPITAL_COMMUNITY): Payer: Self-pay

## 2024-09-29 ENCOUNTER — Other Ambulatory Visit (HOSPITAL_COMMUNITY): Payer: Self-pay

## 2024-10-19 ENCOUNTER — Other Ambulatory Visit (HOSPITAL_COMMUNITY): Payer: Self-pay

## 2024-10-19 ENCOUNTER — Other Ambulatory Visit: Payer: Self-pay

## 2024-10-19 DIAGNOSIS — E66813 Obesity, class 3: Secondary | ICD-10-CM | POA: Diagnosis not present

## 2024-10-19 DIAGNOSIS — E282 Polycystic ovarian syndrome: Secondary | ICD-10-CM | POA: Diagnosis not present

## 2024-10-19 DIAGNOSIS — Z6841 Body Mass Index (BMI) 40.0 and over, adult: Secondary | ICD-10-CM | POA: Diagnosis not present

## 2024-10-19 DIAGNOSIS — L680 Hirsutism: Secondary | ICD-10-CM | POA: Diagnosis not present

## 2024-10-19 MED ORDER — WEGOVY 0.25 MG/0.5ML ~~LOC~~ SOAJ
0.2500 mg | SUBCUTANEOUS | 0 refills | Status: DC
  Filled 2024-10-19 – 2024-10-25 (×2): qty 2, 28d supply, fill #0

## 2024-10-19 MED ORDER — SPIRONOLACTONE 25 MG PO TABS
25.0000 mg | ORAL_TABLET | Freq: Every day | ORAL | 1 refills | Status: AC
  Filled 2024-10-19: qty 30, 30d supply, fill #0
  Filled 2024-12-05: qty 30, 30d supply, fill #1

## 2024-10-25 ENCOUNTER — Other Ambulatory Visit (HOSPITAL_COMMUNITY): Payer: Self-pay

## 2024-11-22 ENCOUNTER — Other Ambulatory Visit (HOSPITAL_COMMUNITY): Payer: Self-pay

## 2024-11-22 MED ORDER — PRAZOSIN HCL 2 MG PO CAPS
2.0000 mg | ORAL_CAPSULE | Freq: Every day | ORAL | 1 refills | Status: AC
  Filled 2024-11-22: qty 90, 90d supply, fill #0

## 2024-12-05 ENCOUNTER — Other Ambulatory Visit (HOSPITAL_COMMUNITY): Payer: Self-pay

## 2024-12-07 ENCOUNTER — Other Ambulatory Visit (HOSPITAL_COMMUNITY): Payer: Self-pay

## 2024-12-07 MED ORDER — WEGOVY 0.5 MG/0.5ML ~~LOC~~ SOAJ
0.5000 mg | SUBCUTANEOUS | 0 refills | Status: AC
  Filled 2024-12-07: qty 2, 28d supply, fill #0

## 2024-12-08 ENCOUNTER — Other Ambulatory Visit (HOSPITAL_COMMUNITY): Payer: Self-pay

## 2024-12-09 ENCOUNTER — Other Ambulatory Visit (HOSPITAL_COMMUNITY): Payer: Self-pay

## 2024-12-14 ENCOUNTER — Other Ambulatory Visit: Payer: Self-pay

## 2024-12-14 ENCOUNTER — Other Ambulatory Visit (HOSPITAL_COMMUNITY): Payer: Self-pay

## 2024-12-14 MED ORDER — PROPRANOLOL HCL 20 MG PO TABS
20.0000 mg | ORAL_TABLET | ORAL | 3 refills | Status: AC | PRN
  Filled 2024-12-14: qty 36, 84d supply, fill #0

## 2024-12-15 ENCOUNTER — Other Ambulatory Visit (HOSPITAL_COMMUNITY): Payer: Self-pay

## 2024-12-19 ENCOUNTER — Other Ambulatory Visit (HOSPITAL_COMMUNITY): Payer: Self-pay

## 2024-12-22 ENCOUNTER — Other Ambulatory Visit (HOSPITAL_COMMUNITY): Payer: Self-pay

## 2024-12-22 ENCOUNTER — Other Ambulatory Visit (HOSPITAL_BASED_OUTPATIENT_CLINIC_OR_DEPARTMENT_OTHER): Payer: Self-pay

## 2024-12-22 MED ORDER — SPIRONOLACTONE 50 MG PO TABS
50.0000 mg | ORAL_TABLET | Freq: Every day | ORAL | 0 refills | Status: AC
  Filled 2024-12-22: qty 90, 90d supply, fill #0

## 2024-12-26 ENCOUNTER — Other Ambulatory Visit (HOSPITAL_COMMUNITY): Payer: Self-pay
